# Patient Record
Sex: Male | Born: 1935 | Race: White | Hispanic: No | Marital: Married | Smoking: Former smoker
Health system: Southern US, Community
[De-identification: ages and names within clinical notes are randomized; demographics above are authoritative.]

## PROBLEM LIST (undated history)

## (undated) DIAGNOSIS — E119 Type 2 diabetes mellitus without complications: Secondary | ICD-10-CM

## (undated) DIAGNOSIS — G4730 Sleep apnea, unspecified: Secondary | ICD-10-CM

## (undated) DIAGNOSIS — F32A Depression, unspecified: Secondary | ICD-10-CM

## (undated) DIAGNOSIS — J449 Chronic obstructive pulmonary disease, unspecified: Secondary | ICD-10-CM

## (undated) DIAGNOSIS — I10 Essential (primary) hypertension: Secondary | ICD-10-CM

## (undated) DIAGNOSIS — F329 Major depressive disorder, single episode, unspecified: Secondary | ICD-10-CM

## (undated) DIAGNOSIS — K219 Gastro-esophageal reflux disease without esophagitis: Secondary | ICD-10-CM

## (undated) HISTORY — PX: CORONARY ANGIOPLASTY WITH STENT PLACEMENT: SHX49

## (undated) HISTORY — PX: CARDIAC CATHETERIZATION: SHX172

---

## 2004-05-17 ENCOUNTER — Encounter: Payer: Self-pay | Admitting: Geriatric Medicine

## 2004-06-08 ENCOUNTER — Encounter: Payer: Self-pay | Admitting: Geriatric Medicine

## 2004-07-06 ENCOUNTER — Encounter: Payer: Self-pay | Admitting: Geriatric Medicine

## 2004-08-06 ENCOUNTER — Encounter: Payer: Self-pay | Admitting: Geriatric Medicine

## 2004-09-20 ENCOUNTER — Ambulatory Visit: Payer: Self-pay | Admitting: Ophthalmology

## 2004-09-26 ENCOUNTER — Ambulatory Visit: Payer: Self-pay | Admitting: Ophthalmology

## 2006-09-19 ENCOUNTER — Ambulatory Visit: Payer: Self-pay | Admitting: Ophthalmology

## 2006-09-24 ENCOUNTER — Ambulatory Visit: Payer: Self-pay | Admitting: Ophthalmology

## 2009-01-06 ENCOUNTER — Ambulatory Visit: Payer: Self-pay | Admitting: Geriatric Medicine

## 2010-07-29 IMAGING — US ULTRASOUND LEFT BREAST
1 series · 15 of 15 positions shown · non-contrast
Comparison: None.

REASON FOR EXAM: left breast lump right breast lump  If needed
COMMENTS:

PROCEDURE:     US  - US BREAST LEFT  - January 06, 2009  [DATE]
RESULT:

[Series 1: ultrasound left breast · 15 of 15 slices shown]
[im 1/15]
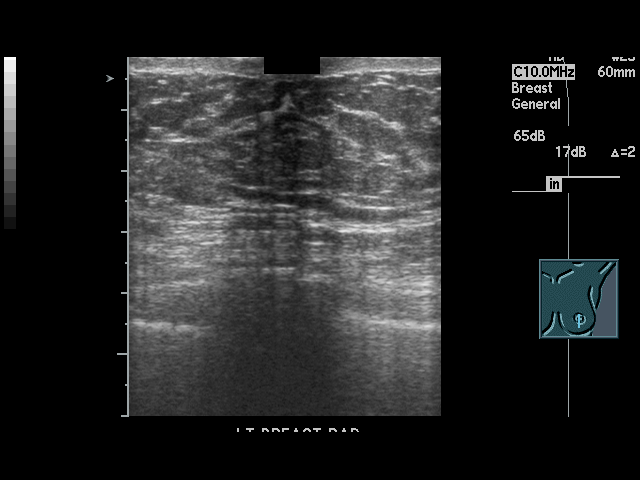
[im 2/15]
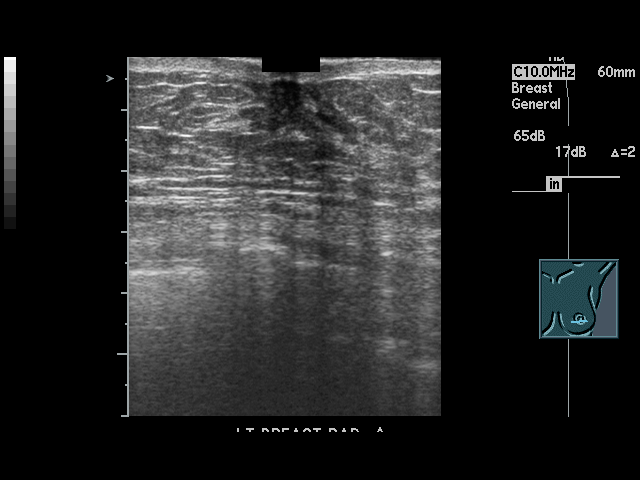
[im 3/15]
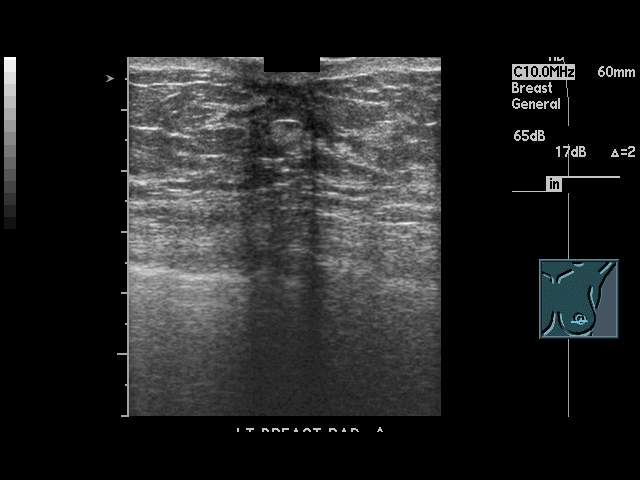
[im 4/15]
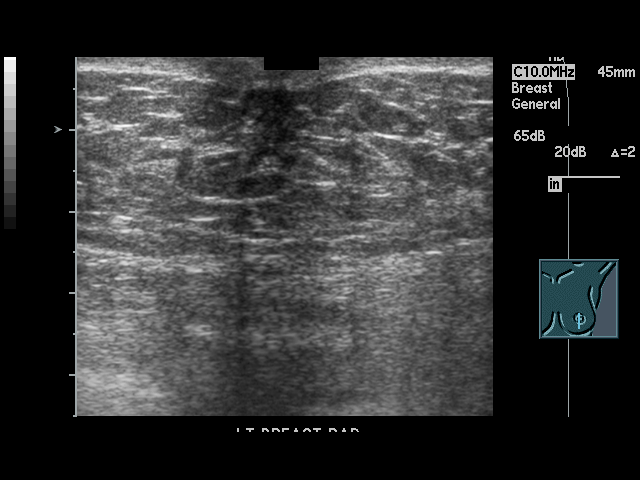
[im 5/15]
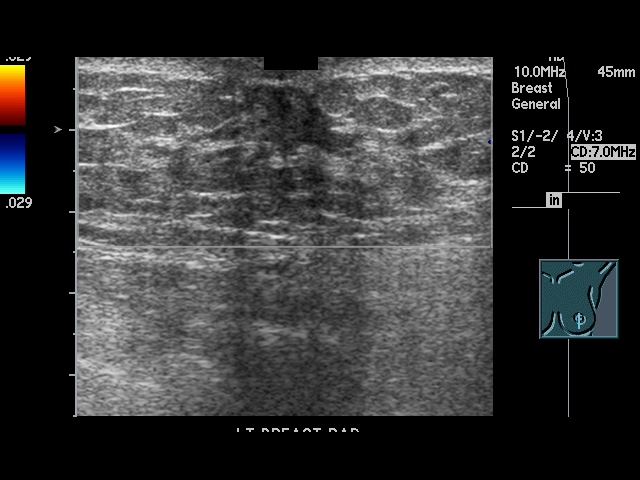
[im 6/15]
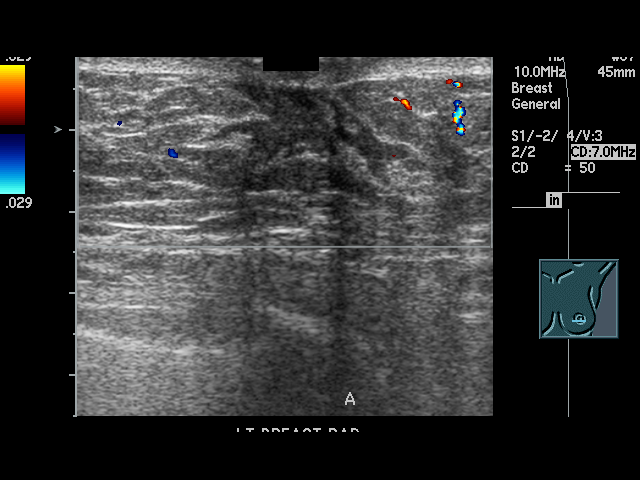
[im 7/15]
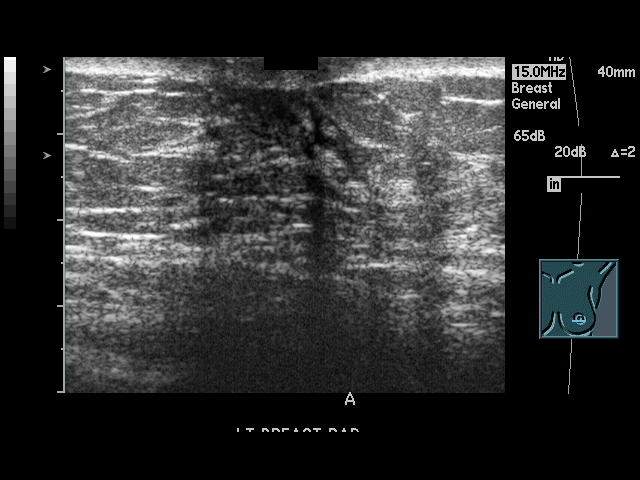
[im 8/15]
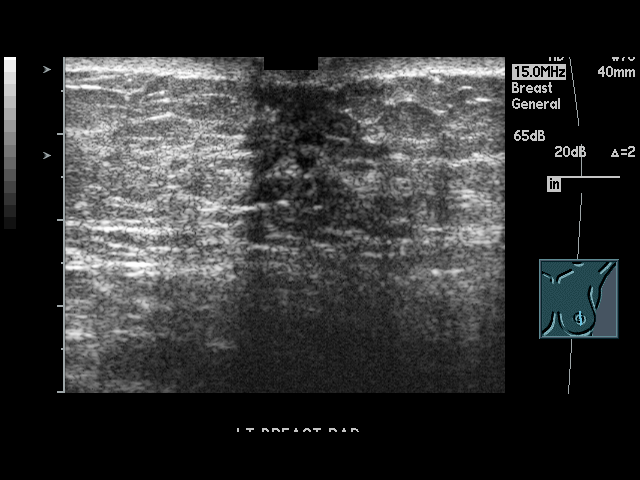
[im 9/15]
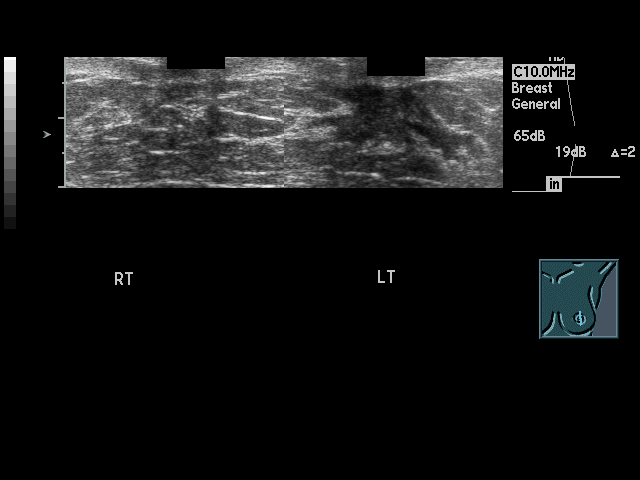
[im 10/15]
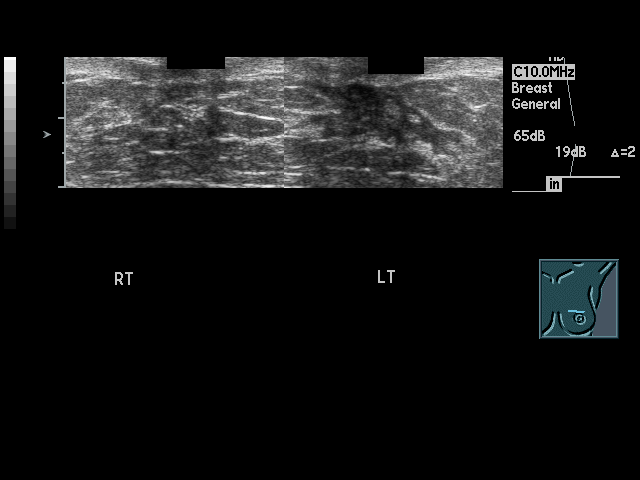
[im 11/15]
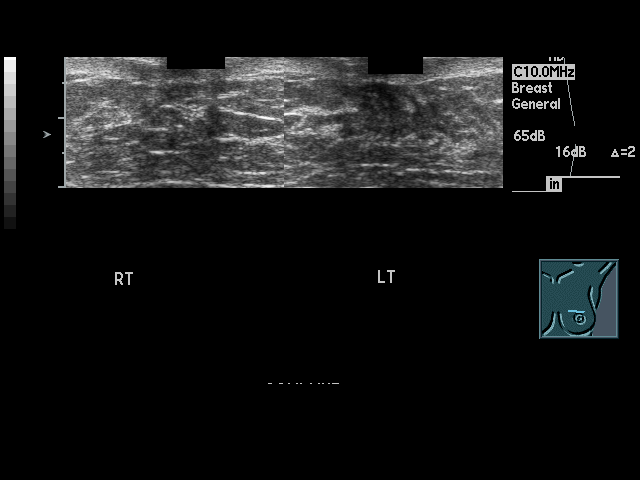
[im 12/15]
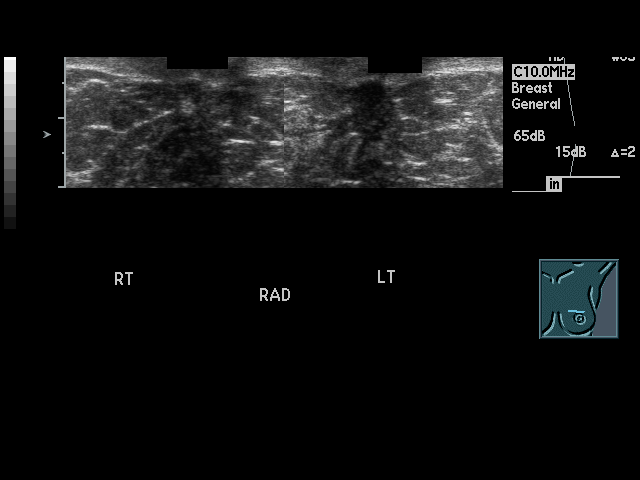
[im 13/15]
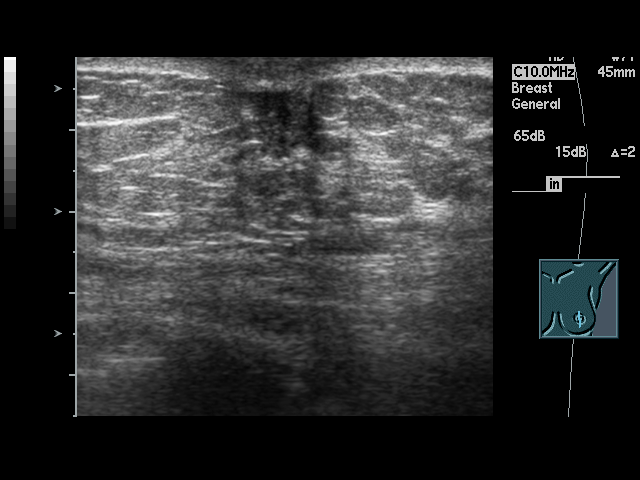
[im 14/15]
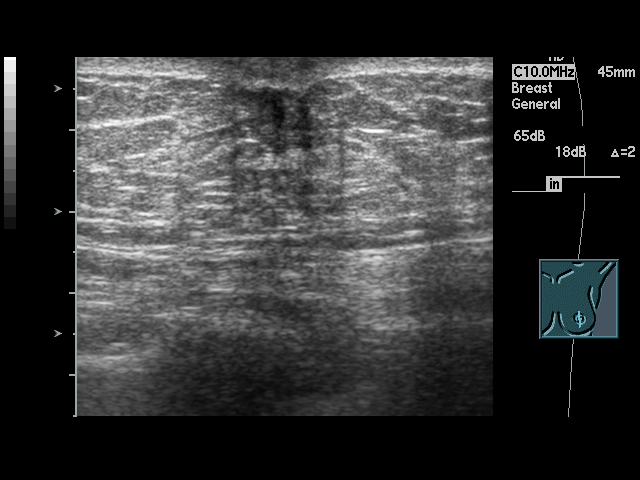
[im 15/15]
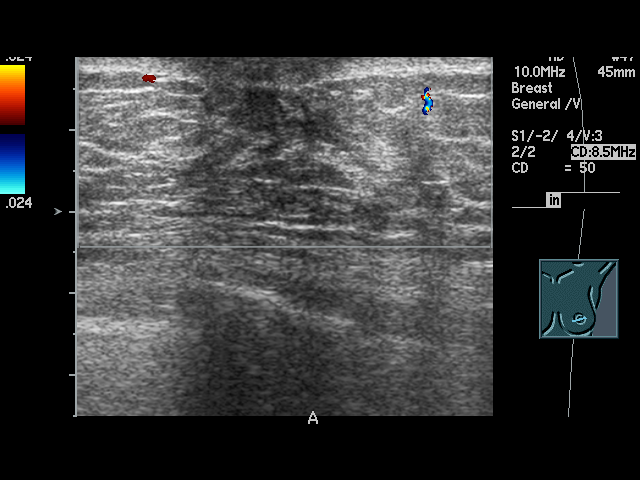

[15 of 15 positions shown; findings below may reference images not displayed]

FINDINGS: Bilateral diagnostic mammogram is performed. There is
retroglandular, flame-shaped fibroglandular tissue in the left breast. There
is a small area of glandular retroareolar tissue in the right breast which
is significantly smaller in size compared with the contralateral left
breast. There is no dominant mass, architectural distortion or clusters of
suspicious microcalcifications. There are dermal calcifications noted
bilaterally.

Further evaluation is performed with real-time sonography of the left
breast. The left breast retroareolar region demonstrates an irregular,
hypoechoic, retroareolar area. There is a smaller but similar appearance in
the right retroareolar region.
IMPRESSION: Flame-shaped, left retroareolar, fibroglandular tissue with
a smaller area in the right retroareolar region most consistent with
gynecomastia.

BI-RADS: Category 2 - Benign Finding
IMPRESSION:

## 2011-03-07 ENCOUNTER — Ambulatory Visit: Payer: Self-pay | Admitting: Geriatric Medicine

## 2011-08-25 ENCOUNTER — Ambulatory Visit: Payer: Self-pay | Admitting: Gastroenterology

## 2011-08-28 LAB — PATHOLOGY REPORT

## 2012-05-24 ENCOUNTER — Encounter: Payer: Self-pay | Admitting: Nurse Practitioner

## 2012-09-26 IMAGING — CR DG CHEST 2V
1 series · 3 of 3 positions shown · non-contrast
Comparison: none

REASON FOR EXAM: chest pain right sided rib pain Fax Results [phone]
COMMENTS:

PROCEDURE:     KDR - KDXR CHEST PA (OR AP) AND LAT  - March 07, 2011  [DATE]
RESULT:     The lung fields are clear. No pneumonia, pneumothorax or pleural
effusion is seen. The heart size is within normal limits. The mediastinal
and osseous structures show no significant abnormalities.

[Series 1: view not recorded · 0.17mm/px · 3 of 3 slices shown]
[im 1/3]
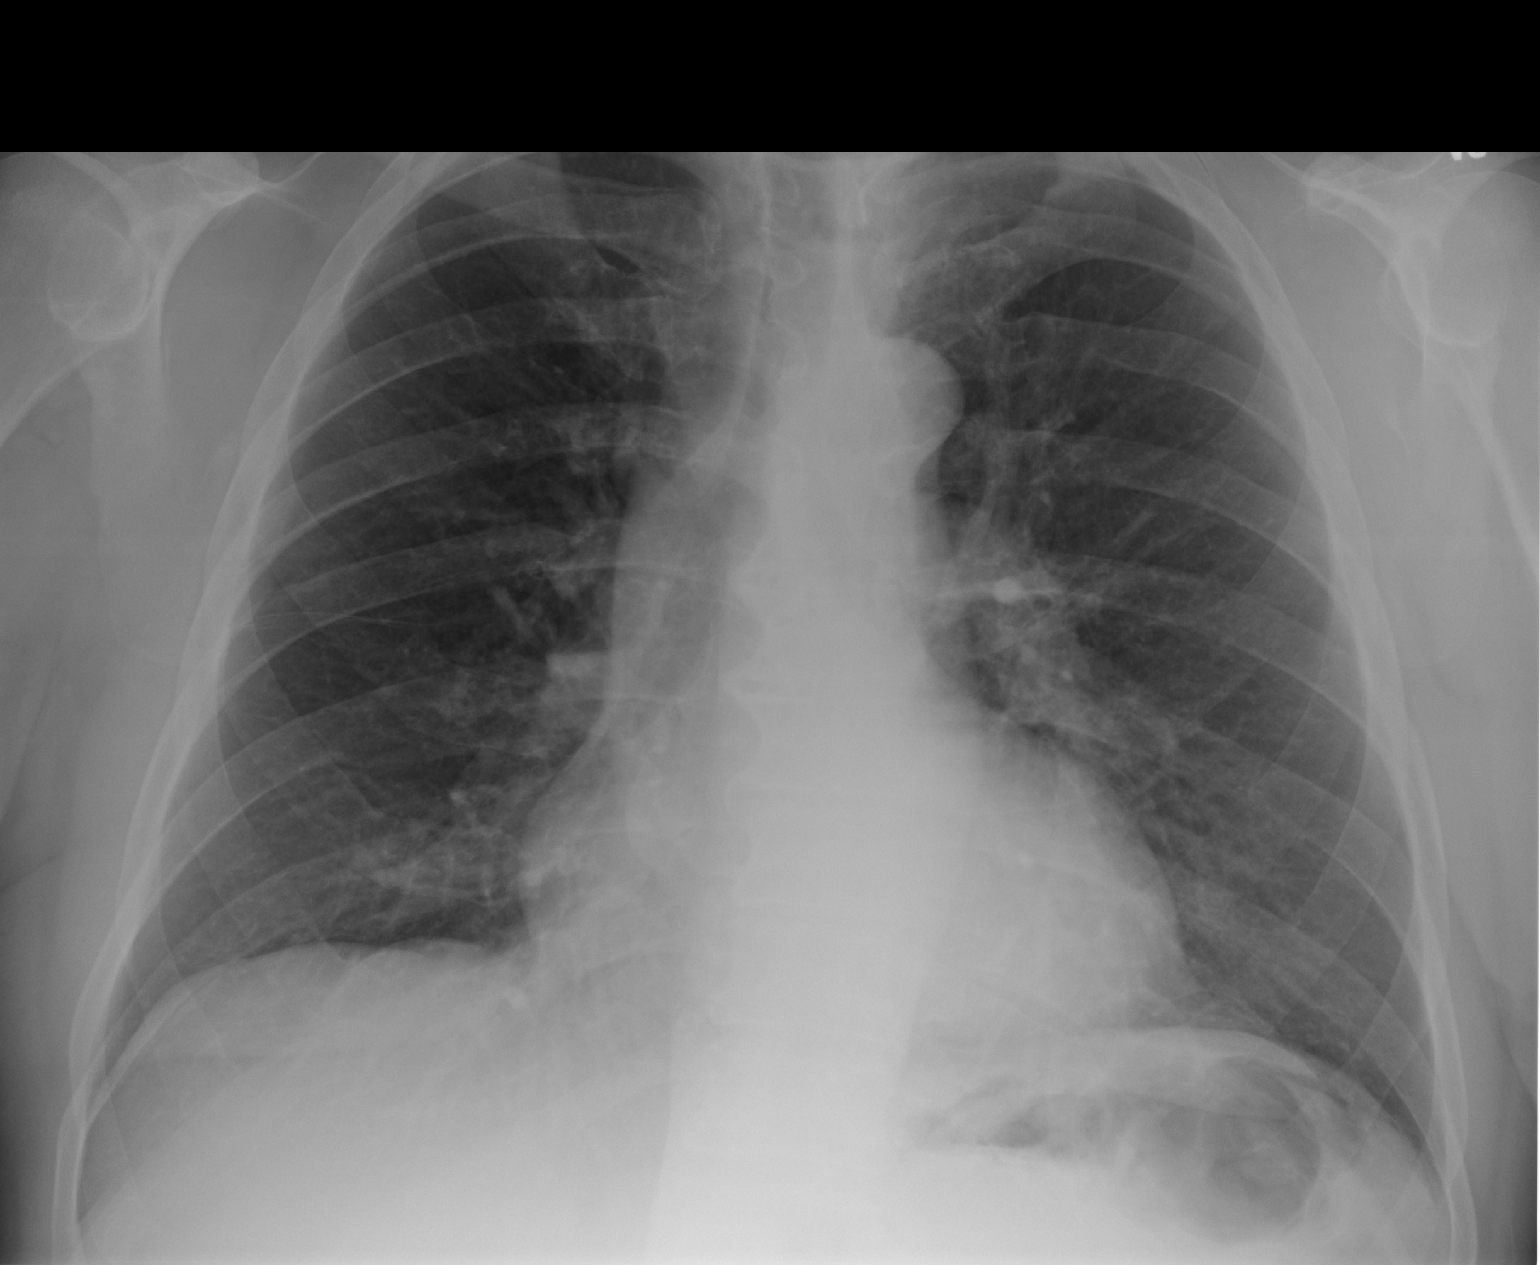
[im 2/3]
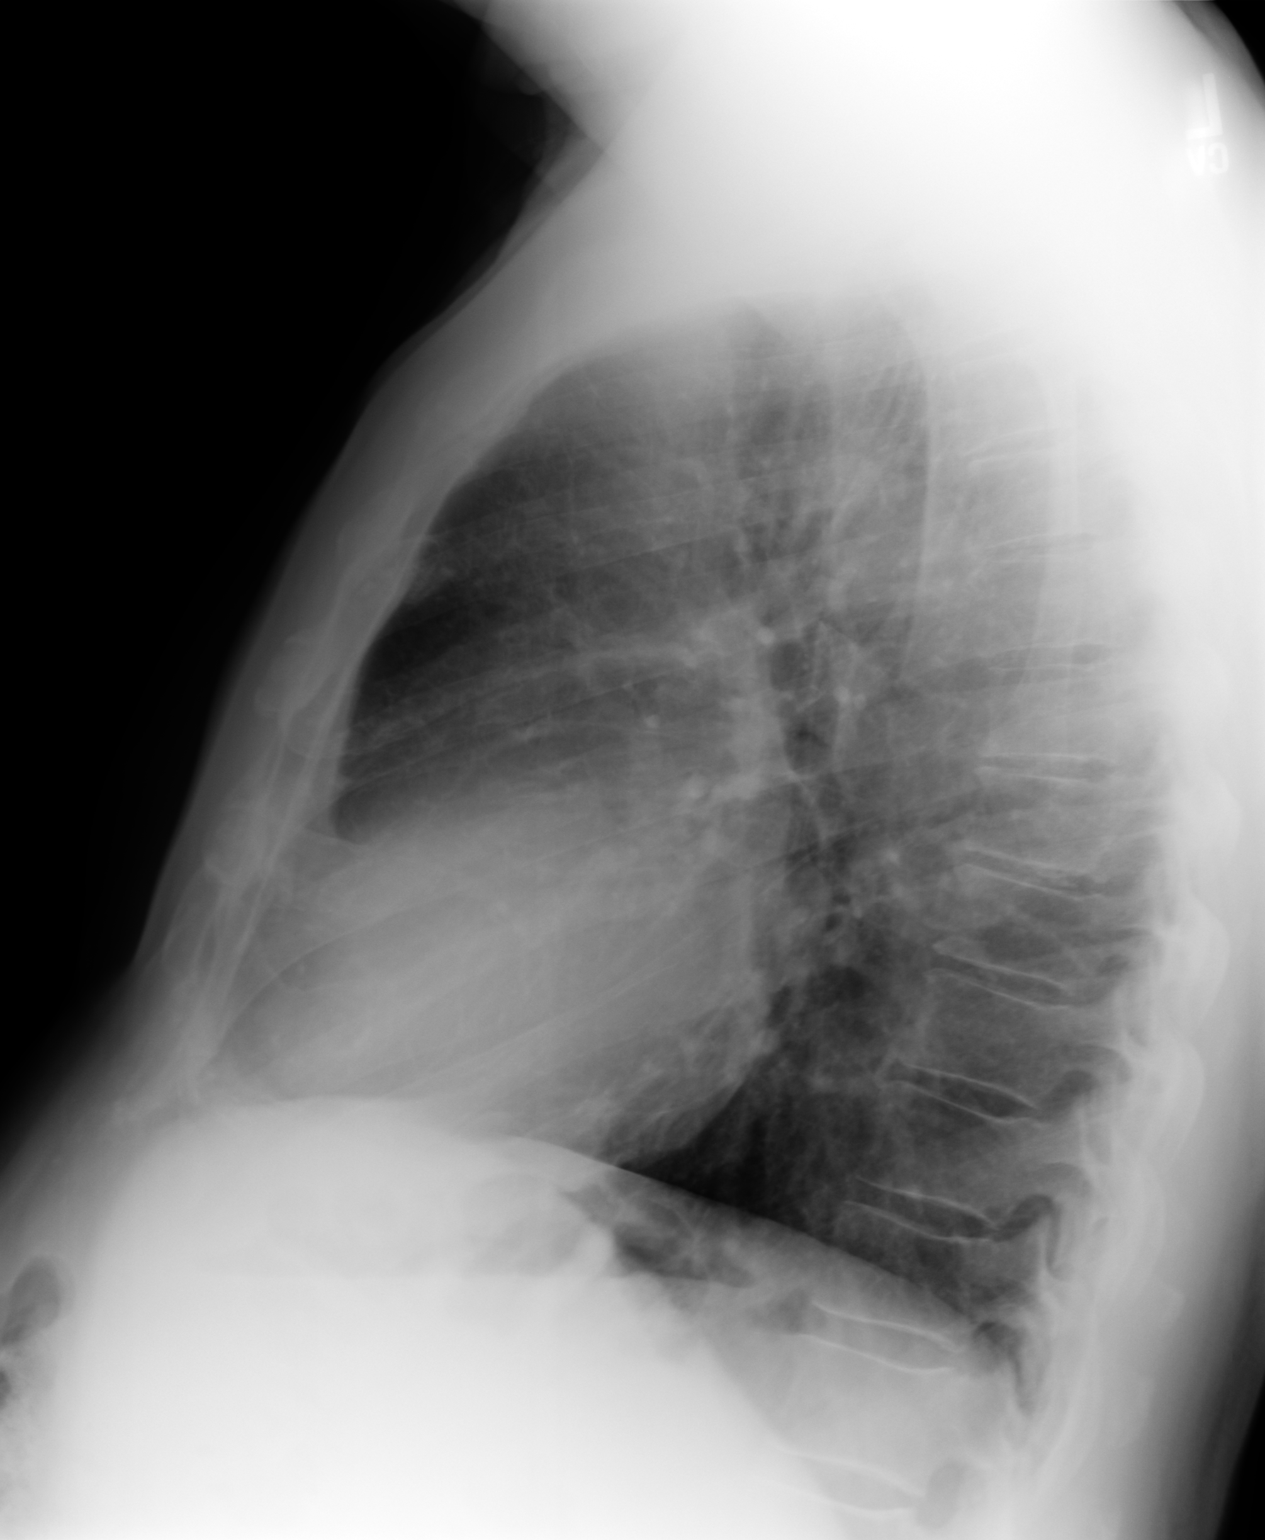
[im 3/3]
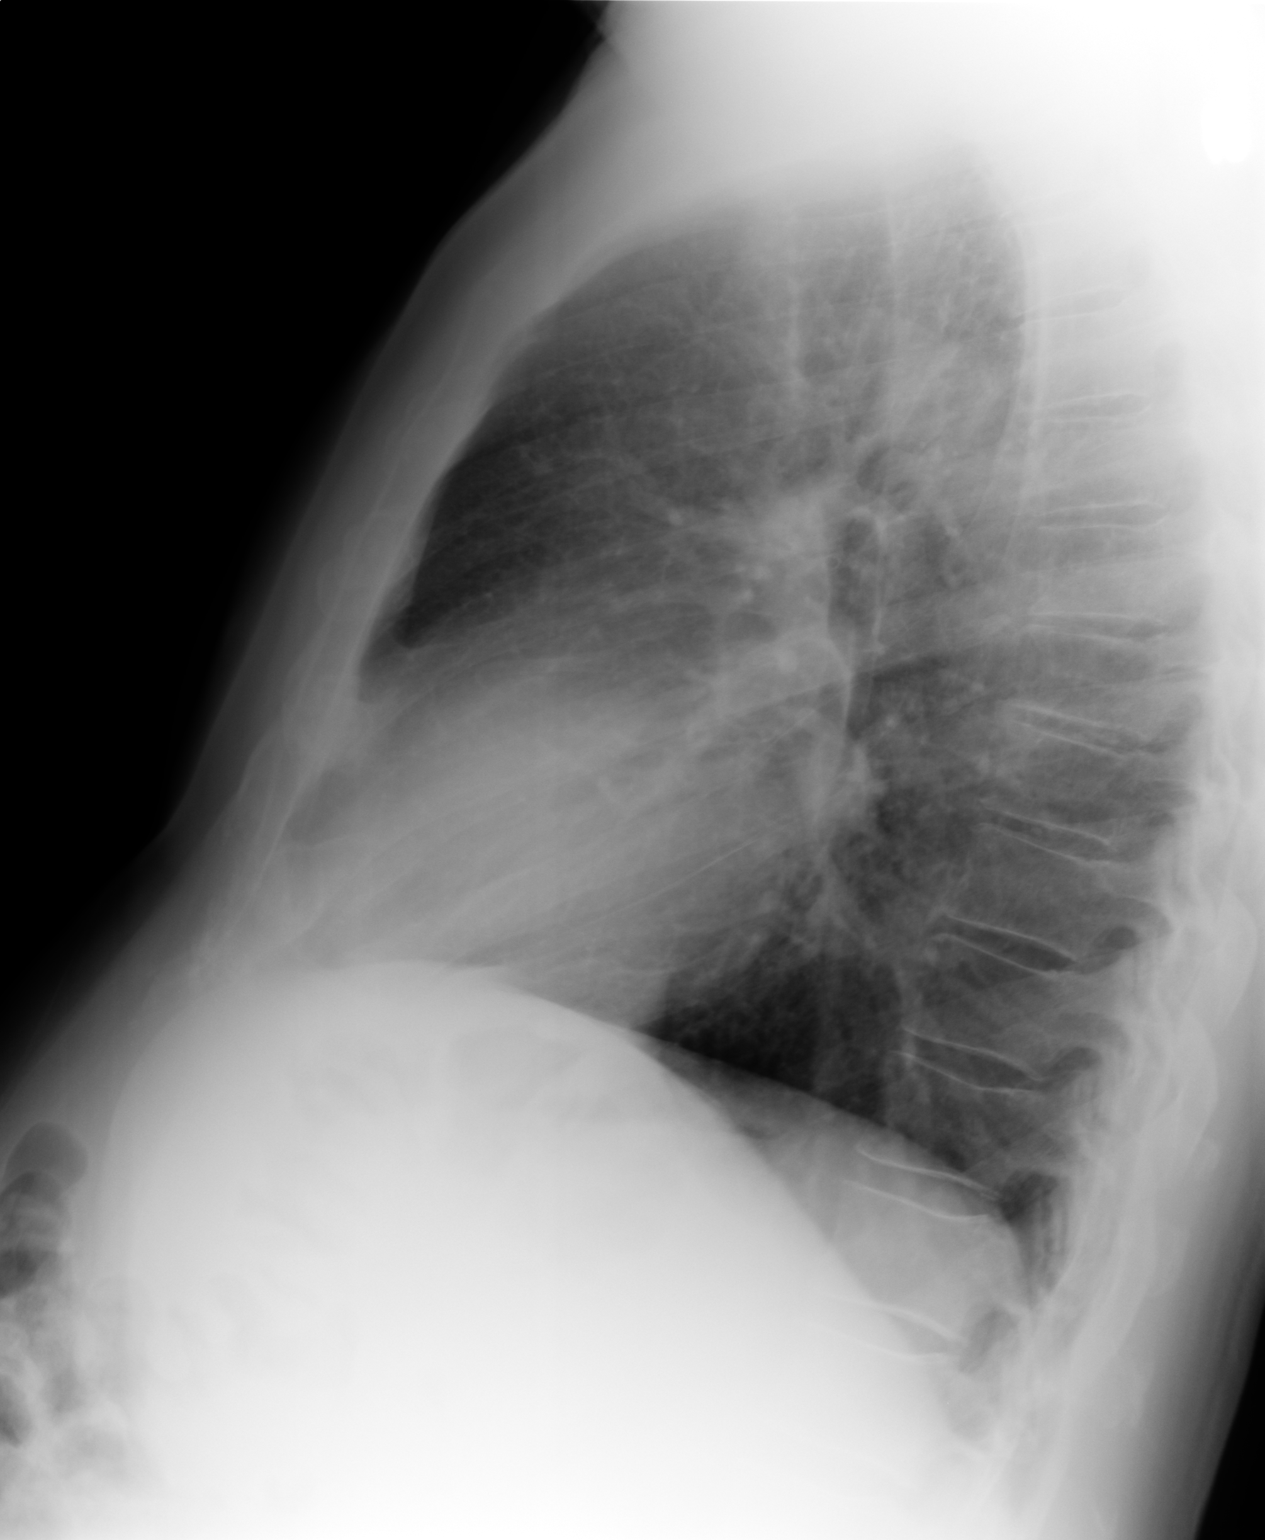

[3 of 3 positions shown; findings below may reference images not displayed]

IMPRESSION: No acute changes are identified.

## 2014-10-21 ENCOUNTER — Encounter: Payer: Self-pay | Admitting: *Deleted

## 2014-10-22 ENCOUNTER — Ambulatory Visit: Payer: Medicare Other | Admitting: Anesthesiology

## 2014-10-22 ENCOUNTER — Encounter: Payer: Self-pay | Admitting: Anesthesiology

## 2014-10-22 ENCOUNTER — Ambulatory Visit
Admission: RE | Admit: 2014-10-22 | Discharge: 2014-10-22 | Disposition: A | Discharge status: Home / Self Care | Payer: Medicare Other | Source: Ambulatory Visit | Attending: Unknown Physician Specialty | Admitting: Unknown Physician Specialty

## 2014-10-22 ENCOUNTER — Encounter
Admission: RE | Disposition: A | Discharge status: Home / Self Care | Payer: Self-pay | Source: Ambulatory Visit | Attending: Unknown Physician Specialty

## 2014-10-22 DIAGNOSIS — J449 Chronic obstructive pulmonary disease, unspecified: Secondary | ICD-10-CM | POA: Diagnosis not present

## 2014-10-22 DIAGNOSIS — K219 Gastro-esophageal reflux disease without esophagitis: Secondary | ICD-10-CM | POA: Diagnosis not present

## 2014-10-22 DIAGNOSIS — Z1211 Encounter for screening for malignant neoplasm of colon: Secondary | ICD-10-CM | POA: Insufficient documentation

## 2014-10-22 DIAGNOSIS — Z794 Long term (current) use of insulin: Secondary | ICD-10-CM | POA: Diagnosis not present

## 2014-10-22 DIAGNOSIS — D123 Benign neoplasm of transverse colon: Secondary | ICD-10-CM | POA: Diagnosis not present

## 2014-10-22 DIAGNOSIS — K648 Other hemorrhoids: Secondary | ICD-10-CM | POA: Insufficient documentation

## 2014-10-22 DIAGNOSIS — D122 Benign neoplasm of ascending colon: Secondary | ICD-10-CM | POA: Diagnosis not present

## 2014-10-22 DIAGNOSIS — I10 Essential (primary) hypertension: Secondary | ICD-10-CM | POA: Insufficient documentation

## 2014-10-22 DIAGNOSIS — D120 Benign neoplasm of cecum: Secondary | ICD-10-CM | POA: Insufficient documentation

## 2014-10-22 DIAGNOSIS — Z86010 Personal history of colonic polyps: Secondary | ICD-10-CM | POA: Insufficient documentation

## 2014-10-22 DIAGNOSIS — E119 Type 2 diabetes mellitus without complications: Secondary | ICD-10-CM | POA: Insufficient documentation

## 2014-10-22 DIAGNOSIS — Z79899 Other long term (current) drug therapy: Secondary | ICD-10-CM | POA: Diagnosis not present

## 2014-10-22 DIAGNOSIS — G4730 Sleep apnea, unspecified: Secondary | ICD-10-CM | POA: Insufficient documentation

## 2014-10-22 DIAGNOSIS — F329 Major depressive disorder, single episode, unspecified: Secondary | ICD-10-CM | POA: Insufficient documentation

## 2014-10-22 DIAGNOSIS — Z87891 Personal history of nicotine dependence: Secondary | ICD-10-CM | POA: Insufficient documentation

## 2014-10-22 DIAGNOSIS — Z7982 Long term (current) use of aspirin: Secondary | ICD-10-CM | POA: Insufficient documentation

## 2014-10-22 HISTORY — DX: Depression, unspecified: F32.A

## 2014-10-22 HISTORY — PX: COLONOSCOPY WITH PROPOFOL: SHX5780

## 2014-10-22 HISTORY — DX: Essential (primary) hypertension: I10

## 2014-10-22 HISTORY — DX: Type 2 diabetes mellitus without complications: E11.9

## 2014-10-22 HISTORY — DX: Chronic obstructive pulmonary disease, unspecified: J44.9

## 2014-10-22 HISTORY — DX: Gastro-esophageal reflux disease without esophagitis: K21.9

## 2014-10-22 HISTORY — DX: Sleep apnea, unspecified: G47.30

## 2014-10-22 HISTORY — DX: Major depressive disorder, single episode, unspecified: F32.9

## 2014-10-22 LAB — GLUCOSE, CAPILLARY: GLUCOSE-CAPILLARY: 120 mg/dL — AB (ref 65–99)

## 2014-10-22 SURGERY — COLONOSCOPY WITH PROPOFOL
Anesthesia: General

## 2014-10-22 MED ORDER — SODIUM CHLORIDE 0.9 % IV SOLN
INTRAVENOUS | Status: DC
Start: 2014-10-22 — End: 2014-10-22
  Administered 2014-10-22: 08:00:00 via INTRAVENOUS

## 2014-10-22 MED ORDER — LACTATED RINGERS IV SOLN
INTRAVENOUS | Status: DC | PRN
Start: 2014-10-22 — End: 2014-10-22
  Administered 2014-10-22: 08:00:00 via INTRAVENOUS

## 2014-10-22 MED ORDER — PROPOFOL INFUSION 10 MG/ML OPTIME
INTRAVENOUS | Status: DC | PRN
  Administered 2014-10-22: 75 ug/kg/min via INTRAVENOUS

## 2014-10-22 MED ORDER — EPHEDRINE SULFATE 50 MG/ML IJ SOLN
INTRAMUSCULAR | Status: DC | PRN
  Administered 2014-10-22 (×2): 5 mg via INTRAVENOUS

## 2014-10-22 MED ORDER — METHYLENE BLUE 1 % INJ SOLN
INTRAMUSCULAR | Status: AC
  Filled 2014-10-22: qty 1

## 2014-10-22 MED ORDER — METHYLENE BLUE 1 % INJ SOLN
1.0000 mL | Freq: Once | INTRAMUSCULAR | Status: AC
  Administered 2014-10-22: 09:00:00

## 2014-10-22 MED ORDER — PROPOFOL 10 MG/ML IV BOLUS
INTRAVENOUS | Status: DC | PRN
  Administered 2014-10-22: 50 mg via INTRAVENOUS

## 2014-10-22 NOTE — H&P (Signed)
Primary Care Physician:  Harlow Ohms, MD Primary Gastroenterologist:  Dr. Vira Agar  Pre-Procedure History & Physical: HPI:  Calvin Rhodes is a 81 y.o. male is here for an colonoscopy.   Past Medical History  Diagnosis Date  . COPD (chronic obstructive pulmonary disease)   . Sleep apnea   . Hypertension   . GERD (gastroesophageal reflux disease)   . Diabetes mellitus without complication   . Depression     Past Surgical History  Procedure Laterality Date  . Cardiac catheterization    . Coronary angioplasty with stent placement      Prior to Admission medications   Medication Sig Start Date End Date Taking? Authorizing Provider  acetaminophen (TYLENOL) 500 MG tablet Take 500 mg by mouth every 6 (six) hours as needed for moderate pain.   Yes Historical Provider, MD  amLODipine (NORVASC) 10 MG tablet Take 10 mg by mouth daily.   Yes Historical Provider, MD  aspirin 81 MG tablet Take 81 mg by mouth daily.   Yes Historical Provider, MD  atorvastatin (LIPITOR) 40 MG tablet Take 40 mg by mouth daily.   Yes Historical Provider, MD  benazepril (LOTENSIN) 40 MG tablet Take 40 mg by mouth daily.   Yes Historical Provider, MD  carvedilol (COREG) 25 MG tablet Take 25 mg by mouth 2 (two) times daily with a meal.   Yes Historical Provider, MD  chlorthalidone (HYGROTON) 25 MG tablet Take 25 mg by mouth daily.   Yes Historical Provider, MD  cloNIDine (CATAPRES - DOSED IN MG/24 HR) 0.3 mg/24hr patch Place 0.3 mg onto the skin once a week.   Yes Historical Provider, MD  eplerenone (INSPRA) 50 MG tablet Take 50 mg by mouth daily.   Yes Historical Provider, MD  insulin aspart protamine- aspart (NOVOLOG MIX 70/30) (70-30) 100 UNIT/ML injection Inject 20-40 Units into the skin 2 (two) times daily. 40 in the morning and 20 at night   Yes Historical Provider, MD  metFORMIN (GLUCOPHAGE) 1000 MG tablet Take 1,000 mg by mouth 2 (two) times daily with a meal.   Yes Historical Provider, MD  minoxidil  (LONITEN) 2.5 MG tablet Take 5 mg by mouth 2 (two) times daily.   Yes Historical Provider, MD  Multiple Vitamins-Minerals (PRESERVISION AREDS 2 PO) Take 1 capsule by mouth 2 (two) times daily.   Yes Historical Provider, MD  omeprazole (PRILOSEC) 20 MG capsule Take 20 mg by mouth daily.   Yes Historical Provider, MD  PARoxetine (PAXIL) 20 MG tablet Take 20 mg by mouth daily.   Yes Historical Provider, MD    Allergies as of 09/11/2014  . (Not on File)    History reviewed. No pertinent family history.  History   Social History  . Marital Status: Married    Spouse Name: N/A  . Number of Children: N/A  . Years of Education: N/A   Occupational History  . Not on file.   Social History Main Topics  . Smoking status: Former Smoker    Quit date: 10/21/1984  . Smokeless tobacco: Not on file  . Alcohol Use: No  . Drug Use: Not on file  . Sexual Activity: Not on file   Other Topics Concern  . Not on file   Social History Narrative    Review of Systems: See HPI, otherwise negative ROS  Physical Exam: BP 129/58 mmHg  Pulse 54  Temp(Src) 96.2 F (35.7 C) (Tympanic)  Resp 18  Ht 6\' 2"  (1.88 m)  Wt 99.791 kg (220  lb)  BMI 28.23 kg/m2  SpO2 97% General:   Alert,  pleasant and cooperative in NAD Head:  Normocephalic and atraumatic. Neck:  Supple; no masses or thyromegaly. Lungs:  Clear throughout to auscultation.  Somewhat decreased air flow  Heart:  Mild bradycardia, 2/6 SEM Abdomen:  Soft, nontender and nondistended. Normal bowel sounds, without guarding, and without rebound.   Neurologic:  Alert and  oriented x4;  grossly normal neurologically.  Impression/Plan: Calvin Rhodes is here for an colonoscopy to be performed for personal history of colon polyps  Risks, benefits, limitations, and alternatives regarding  colonoscopy have been reviewed with the patient.  Questions have been answered.  All parties agreeable.   Gaylyn Cheers, MD  10/22/2014, 8:20 AM   Primary  Care Physician:  Harlow Ohms, MD Primary Gastroenterologist:  Dr. Vira Agar  Pre-Procedure History & Physical: HPI:  Calvin Rhodes is a 81 y.o. male is here for an colonoscopy.   Past Medical History  Diagnosis Date  . COPD (chronic obstructive pulmonary disease)   . Sleep apnea   . Hypertension   . GERD (gastroesophageal reflux disease)   . Diabetes mellitus without complication   . Depression     Past Surgical History  Procedure Laterality Date  . Cardiac catheterization    . Coronary angioplasty with stent placement      Prior to Admission medications   Medication Sig Start Date End Date Taking? Authorizing Provider  acetaminophen (TYLENOL) 500 MG tablet Take 500 mg by mouth every 6 (six) hours as needed for moderate pain.   Yes Historical Provider, MD  amLODipine (NORVASC) 10 MG tablet Take 10 mg by mouth daily.   Yes Historical Provider, MD  aspirin 81 MG tablet Take 81 mg by mouth daily.   Yes Historical Provider, MD  atorvastatin (LIPITOR) 40 MG tablet Take 40 mg by mouth daily.   Yes Historical Provider, MD  benazepril (LOTENSIN) 40 MG tablet Take 40 mg by mouth daily.   Yes Historical Provider, MD  carvedilol (COREG) 25 MG tablet Take 25 mg by mouth 2 (two) times daily with a meal.   Yes Historical Provider, MD  chlorthalidone (HYGROTON) 25 MG tablet Take 25 mg by mouth daily.   Yes Historical Provider, MD  cloNIDine (CATAPRES - DOSED IN MG/24 HR) 0.3 mg/24hr patch Place 0.3 mg onto the skin once a week.   Yes Historical Provider, MD  eplerenone (INSPRA) 50 MG tablet Take 50 mg by mouth daily.   Yes Historical Provider, MD  insulin aspart protamine- aspart (NOVOLOG MIX 70/30) (70-30) 100 UNIT/ML injection Inject 20-40 Units into the skin 2 (two) times daily. 40 in the morning and 20 at night   Yes Historical Provider, MD  metFORMIN (GLUCOPHAGE) 1000 MG tablet Take 1,000 mg by mouth 2 (two) times daily with a meal.   Yes Historical Provider, MD  minoxidil (LONITEN) 2.5 MG  tablet Take 5 mg by mouth 2 (two) times daily.   Yes Historical Provider, MD  Multiple Vitamins-Minerals (PRESERVISION AREDS 2 PO) Take 1 capsule by mouth 2 (two) times daily.   Yes Historical Provider, MD  omeprazole (PRILOSEC) 20 MG capsule Take 20 mg by mouth daily.   Yes Historical Provider, MD  PARoxetine (PAXIL) 20 MG tablet Take 20 mg by mouth daily.   Yes Historical Provider, MD    Allergies as of 09/11/2014  . (Not on File)    History reviewed. No pertinent family history.  History   Social History  . Marital Status:  Married    Spouse Name: N/A  . Number of Children: N/A  . Years of Education: N/A   Occupational History  . Not on file.   Social History Main Topics  . Smoking status: Former Smoker    Quit date: 10/21/1984  . Smokeless tobacco: Not on file  . Alcohol Use: No  . Drug Use: Not on file  . Sexual Activity: Not on file   Other Topics Concern  . Not on file   Social History Narrative    Review of Systems: See HPI, otherwise negative ROS  Physical Exam: BP 129/58 mmHg  Pulse 54  Temp(Src) 96.2 F (35.7 C) (Tympanic)  Resp 18  Ht 6\' 2"  (1.88 m)  Wt 99.791 kg (220 lb)  BMI 28.23 kg/m2  SpO2 97% General:   Alert,  pleasant and cooperative in NAD Head:  Normocephalic and atraumatic. Neck:  Supple; no masses or thyromegaly. Lungs:  Clear throughout to auscultation.    Heart:  Regular rate and rhythm. Abdomen:  Soft, nontender and nondistended. Normal bowel sounds, without guarding, and without rebound.   Neurologic:  Alert and  oriented x4;  grossly normal neurologically.  Impression/Plan: Calvin Rhodes is here for an colonoscopy to be performed for personal history of colon polyps   Risks, benefits, limitations, and alternatives regarding  colonoscopy have been reviewed with the patient.  Questions have been answered.  All parties agreeable.   Gaylyn Cheers, MD  10/22/2014, 8:20 AM

## 2014-10-22 NOTE — Op Note (Signed)
[facility] Gastroenterology Patient Name: Calvin Rhodes Procedure Date: 10/22/2014 8:07 AM MRN: [id number] Account #: [id number] Date of Birth: 05/03/1929 Admit Type: Outpatient Age: 81 Room: [facility] ENDO ROOM 1 Gender: Male Note Status: Finalized Procedure:         Colonoscopy Indications:       Personal history of colonic polyps Providers:         Manya Silvas, MD Referring MD:      Zella Richer, MD (Referring MD) Medicines:         Propofol per Anesthesia Complications:     No immediate complications. Procedure:         Pre-Anesthesia Assessment:                    - After reviewing the risks and benefits, the patient was                     deemed in satisfactory condition to undergo the procedure.                    After obtaining informed consent, the colonoscope was                     passed under direct vision. Throughout the procedure, the                     patient's blood pressure, pulse, and oxygen saturations                     were monitored continuously. The Olympus PCF-160AL                     colonoscope (S#. [id number]) was introduced through the anus                     and advanced to the the cecum, identified by appendiceal                     orifice and ileocecal valve. The colonoscopy was                     technically difficult and complex due to restricted                     mobility of the colon, significant looping and a tortuous                     colon. Successful completion of the procedure was aided by                     applying abdominal pressure. The patient tolerated the                     procedure well. The quality of the bowel preparation was                     adequate to identify polyps. Findings:      Two sessile polyps were found in the ascending colon and in the cecum.       The polyps were large in size. These polyps were removed with a saline       injection-lift technique using a hot snare. Resection and  retrieval were       complete. Nine hemostatic clips were successfully placed. There was no  bleeding at the end of the maneuver.      Three sessile polyps were found in the transverse colon and in the       ascending colon. The polyps were medium in size. These polyps were       removed with a hot snare. Resection and retrieval were complete. One       hemostatic clip was successfully placed. There was no bleeding during,       and at the end, of the procedure.      Internal hemorrhoids were found during endoscopy. The hemorrhoids were       small and Grade I (internal hemorrhoids that do not prolapse). Impression:        - Two large polyps in the ascending colon and in the                     cecum. Resected and retrieved. Clips were placed.                    - Three medium polyps in the transverse colon and in the                     ascending colon. Resected and retrieved. Clip was placed.                    - Internal hemorrhoids. Recommendation:    - Await pathology results. Manya Silvas, MD 10/22/2014 10:06:57 AM This report has been signed electronically. Number of Addenda: 0 Note Initiated On: 10/22/2014 8:07 AM Scope Withdrawal Time: 1 hour 14 minutes 14 seconds  Total Procedure Duration: 1 hour 30 minutes 36 seconds       [facility]

## 2014-10-22 NOTE — Transfer of Care (Signed)
Immediate Anesthesia Transfer of Care Note  Patient: Calvin Rhodes  Procedure(s) Performed: Procedure(s): COLONOSCOPY WITH PROPOFOL (N/A)  Patient Location: PACU and Endoscopy Unit  Anesthesia Type:General  Level of Consciousness: responds to stimulation  Airway & Oxygen Therapy: Patient Spontanous Breathing and Patient connected to nasal cannula oxygen  Post-op Assessment: Report given to RN and Post -op Vital signs reviewed and stable  Post vital signs: Reviewed and stable  Last Vitals:  Filed Vitals:   10/22/14 0757  BP: 129/58  Pulse: 54  Temp: 35.7 C  Resp: 18    Complications: No apparent anesthesia complications

## 2014-10-22 NOTE — Anesthesia Preprocedure Evaluation (Signed)
Anesthesia Evaluation  Patient identified by MRN, date of birth, ID band Patient awake    Reviewed: Allergy & Precautions, NPO status , Patient's Chart, lab work & pertinent test results, reviewed documented beta blocker date and time   Airway Mallampati: III  TM Distance: >3 FB     Dental  (+) Chipped   Pulmonary sleep apnea and Continuous Positive Airway Pressure Ventilation , COPD         Cardiovascular hypertension,     Neuro/Psych PSYCHIATRIC DISORDERS Depression    GI/Hepatic   Endo/Other  diabetes  Renal/GU      Musculoskeletal   Abdominal   Peds  Hematology   Anesthesia Other Findings   Reproductive/Obstetrics                             Anesthesia Physical Anesthesia Plan  ASA: III  Anesthesia Plan: General   Post-op Pain Management:    Induction: Intravenous  Airway Management Planned: Nasal Cannula  Additional Equipment:   Intra-op Plan:   Post-operative Plan:   Informed Consent: I have reviewed the patients History and Physical, chart, labs and discussed the procedure including the risks, benefits and alternatives for the proposed anesthesia with the patient or authorized representative who has indicated his/her understanding and acceptance.     Plan Discussed with: CRNA  Anesthesia Plan Comments:         Anesthesia Quick Evaluation

## 2014-10-22 NOTE — Anesthesia Postprocedure Evaluation (Signed)
  Anesthesia Post-op Note  Patient: Calvin Rhodes  Procedure(s) Performed: Procedure(s): COLONOSCOPY WITH PROPOFOL (N/A)  Anesthesia type:General  Patient location: PACU  Post pain: Pain level controlled  Post assessment: Post-op Vital signs reviewed, Patient's Cardiovascular Status Stable, Respiratory Function Stable, Patent Airway and No signs of Nausea or vomiting  Post vital signs: Reviewed and stable  Last Vitals:  Filed Vitals:   10/22/14 1010  BP:   Pulse:   Temp: 35.6 C  Resp:     Level of consciousness: awake, alert  and patient cooperative  Complications: No apparent anesthesia complications

## 2014-10-23 LAB — SURGICAL PATHOLOGY

## 2014-11-02 ENCOUNTER — Encounter: Payer: Self-pay | Admitting: Unknown Physician Specialty

## 2015-06-09 DEATH — deceased
